# Patient Record
Sex: Female | Born: 1965 | Race: Black or African American | Hispanic: No | Marital: Married
Health system: Southern US, Community
[De-identification: ages and names within clinical notes are randomized; demographics above are authoritative.]

## PROBLEM LIST (undated history)

## (undated) DIAGNOSIS — E039 Hypothyroidism, unspecified: Secondary | ICD-10-CM

## (undated) DIAGNOSIS — C801 Malignant (primary) neoplasm, unspecified: Secondary | ICD-10-CM

---

## 2018-04-18 ENCOUNTER — Other Ambulatory Visit (HOSPITAL_BASED_OUTPATIENT_CLINIC_OR_DEPARTMENT_OTHER): Payer: Self-pay | Admitting: Physician Assistant

## 2018-04-18 DIAGNOSIS — Z1231 Encounter for screening mammogram for malignant neoplasm of breast: Secondary | ICD-10-CM

## 2018-05-12 ENCOUNTER — Ambulatory Visit (HOSPITAL_BASED_OUTPATIENT_CLINIC_OR_DEPARTMENT_OTHER): Payer: 59

## 2018-05-19 ENCOUNTER — Inpatient Hospital Stay (HOSPITAL_BASED_OUTPATIENT_CLINIC_OR_DEPARTMENT_OTHER): Admission: RE | Admit: 2018-05-19 | Payer: 59 | Source: Ambulatory Visit

## 2018-08-15 ENCOUNTER — Ambulatory Visit (HOSPITAL_BASED_OUTPATIENT_CLINIC_OR_DEPARTMENT_OTHER)
Admission: RE | Admit: 2018-08-15 | Discharge: 2018-08-15 | Disposition: A | Discharge status: Home / Self Care | Payer: 59 | Source: Ambulatory Visit | Attending: Physician Assistant | Admitting: Physician Assistant

## 2018-08-15 ENCOUNTER — Encounter (HOSPITAL_BASED_OUTPATIENT_CLINIC_OR_DEPARTMENT_OTHER): Payer: Self-pay

## 2018-08-15 ENCOUNTER — Other Ambulatory Visit: Payer: Self-pay

## 2018-08-15 DIAGNOSIS — Z1231 Encounter for screening mammogram for malignant neoplasm of breast: Secondary | ICD-10-CM | POA: Diagnosis present

## 2018-08-15 HISTORY — DX: Malignant (primary) neoplasm, unspecified: C80.1

## 2018-08-30 ENCOUNTER — Other Ambulatory Visit: Payer: Self-pay | Admitting: Physician Assistant

## 2018-08-30 DIAGNOSIS — R928 Other abnormal and inconclusive findings on diagnostic imaging of breast: Secondary | ICD-10-CM

## 2018-09-06 ENCOUNTER — Ambulatory Visit
Admission: RE | Admit: 2018-09-06 | Discharge: 2018-09-06 | Disposition: A | Discharge status: Home / Self Care | Payer: 59 | Source: Ambulatory Visit | Attending: Physician Assistant | Admitting: Physician Assistant

## 2018-09-06 ENCOUNTER — Other Ambulatory Visit: Payer: Self-pay

## 2018-09-06 DIAGNOSIS — R928 Other abnormal and inconclusive findings on diagnostic imaging of breast: Secondary | ICD-10-CM

## 2021-02-16 ENCOUNTER — Emergency Department (INDEPENDENT_AMBULATORY_CARE_PROVIDER_SITE_OTHER): Payer: 59

## 2021-02-16 ENCOUNTER — Other Ambulatory Visit: Payer: Self-pay

## 2021-02-16 ENCOUNTER — Emergency Department (INDEPENDENT_AMBULATORY_CARE_PROVIDER_SITE_OTHER)
Admission: EM | Admit: 2021-02-16 | Discharge: 2021-02-16 | Disposition: A | Discharge status: Home / Self Care | Payer: 59 | Source: Home / Self Care | Attending: Family Medicine | Admitting: Family Medicine

## 2021-02-16 DIAGNOSIS — K5900 Constipation, unspecified: Secondary | ICD-10-CM

## 2021-02-16 DIAGNOSIS — D259 Leiomyoma of uterus, unspecified: Secondary | ICD-10-CM | POA: Diagnosis not present

## 2021-02-16 DIAGNOSIS — R1031 Right lower quadrant pain: Secondary | ICD-10-CM | POA: Diagnosis not present

## 2021-02-16 DIAGNOSIS — M25561 Pain in right knee: Secondary | ICD-10-CM

## 2021-02-16 HISTORY — DX: Hypothyroidism, unspecified: E03.9

## 2021-02-16 LAB — POCT URINALYSIS DIP (MANUAL ENTRY)
Bilirubin, UA: NEGATIVE
Blood, UA: NEGATIVE
Glucose, UA: NEGATIVE mg/dL
Ketones, POC UA: NEGATIVE mg/dL
Nitrite, UA: NEGATIVE
Protein Ur, POC: NEGATIVE mg/dL
Spec Grav, UA: 1.02 (ref 1.010–1.025)
Urobilinogen, UA: 0.2 E.U./dL
pH, UA: 7 (ref 5.0–8.0)

## 2021-02-16 NOTE — ED Triage Notes (Signed)
Pt states that she has some abdominal pain mainly right side. X1 week  Pt states that she also has some right knee pain. X2 days

## 2021-02-16 NOTE — Discharge Instructions (Signed)
Recommend taking daily Miralax, approximately 1/4 to 1/2 capful mixed in 4 to 8 ounces of water until bowel movements are regular. Recommend increasing natural fiber in diet.  May also take a daily fiber product such as Citrucel with plenty of fluid.  May take Tylenol daytime for pain.

## 2021-02-16 NOTE — ED Provider Notes (Signed)
[facility]    CSN: [id number] Arrival date & time: 02/16/21  1529      History   Chief Complaint Chief Complaint  Patient presents with   Abdominal Pain    Abdominal pain (right side). X1 week   Knee Pain    Right knee pai. X2 days    HPI Cathy Carter is a 55 y.o. female.   Patient reports that she was awakened by a burning localized pain in her right lower quadrant abdomen about a week ago.  The pain has become constant, radiating intermittently to her left lower quadrant.  The pain is somewhat positional and also exacerbated by eating spicy foods.  She has also developed vague aching in her right knee and upper leg.  She recalls no injury or changes in activities.  She denies pelvic pain or vaginal discharge.  She denies urinary symptoms and changes in bowel movements.  She denies fevers, chills, and sweats although she felt hot on one occasion initially. Surgical history of tubal ligation.  She notes that her GYN exams have been normal.  The history is provided by the patient.  Abdominal Pain Pain location:  RLQ Pain quality: burning   Pain radiates to:  LLQ Pain severity:  Mild Onset quality:  Sudden Duration:  1 week Timing:  Constant Progression:  Worsening Chronicity:  New Context: eating and previous surgery   Context: not awakening from sleep, not diet changes, not laxative use, not recent illness, not recent travel, not suspicious food intake and not trauma   Relieved by:  Nothing Exacerbated by: spicy foods. Ineffective treatments:  None tried Associated symptoms: no anorexia, no belching, no chest pain, no chills, no constipation, no cough, no diarrhea, no dysuria, no fatigue, no fever, no flatus, no hematemesis, no hematochezia, no hematuria, no melena, no nausea, no shortness of breath, no vaginal bleeding, no vaginal discharge and no vomiting   Knee Pain Associated symptoms: no fatigue and no fever    Past Medical History:  Diagnosis Date    Cancer ([place])    thyroid    Hypothyroidism     There are no problems to display for this patient.   Past Surgical History:  Procedure Laterality Date   removal of thyroid      OB History   No obstetric history on file.      Home Medications    Prior to Admission medications   Medication Sig Start Date End Date Taking? Authorizing Provider  levothyroxine (SYNTHROID) 100 MCG tablet Take by mouth. 12/01/20  Yes [provider]  levothyroxine (SYNTHROID) 88 MCG tablet Take 112 mcg by mouth. 12/01/20  Yes [provider]    Family History Family History  Problem Relation Age of Onset   Diabetes Mother    Heart attack Father     Social History Social History   Tobacco Use   Smoking status: Former    Types: Cigarettes   Smokeless tobacco: Never  Substance Use Topics   Alcohol use: Yes   Drug use: Never     Allergies   Patient has no known allergies.   Review of Systems Review of Systems  Constitutional:  Negative for activity change, appetite change, chills, diaphoresis, fatigue, fever and unexpected weight change.  HENT: Negative.    Eyes: Negative.   Respiratory:  Negative for cough and shortness of breath.   Cardiovascular:  Negative for chest pain.  Gastrointestinal:  Positive for abdominal pain. Negative for abdominal distention, anorexia, blood in  stool, constipation, diarrhea, flatus, hematemesis, hematochezia, melena, nausea and vomiting.  Genitourinary:  Negative for dysuria, flank pain, frequency, hematuria, pelvic pain, vaginal bleeding and vaginal discharge.  Musculoskeletal:        Right knee and leg pain.  Skin:  Negative for rash.  Neurological:  Negative for headaches.  Hematological:  Negative for adenopathy.    Physical Exam Triage Vital Signs ED Triage Vitals  Enc Vitals Group     BP 02/16/21 1554 (!) 151/88     Pulse Rate 02/16/21 1554 64     Resp 02/16/21 1554 20     Temp 02/16/21 1554 98.5 F (36.9 C)      Temp Source 02/16/21 1554 Oral     SpO2 02/16/21 1554 98 %     Weight 02/16/21 1550 155 lb (70.3 kg)     Height 02/16/21 1550 5\' 2"  (1.575 m)     Head Circumference --      Peak Flow --      Pain Score 02/16/21 1550 6     Pain Loc --      Pain Edu? --      Excl. in [place]? --    No data found.  Updated Vital Signs BP (!) 151/88 (BP Location: Right Arm)    Pulse 64    Temp 98.5 F (36.9 C) (Oral)    Resp 20    Ht 5\' 2"  (1.575 m)    Wt 70.3 kg    SpO2 98%    BMI 28.35 kg/m   Visual Acuity Right Eye Distance:   Left Eye Distance:   Bilateral Distance:    Right Eye Near:   Left Eye Near:    Bilateral Near:     Physical Exam Vitals and nursing note reviewed.  Constitutional:      General: She is not in acute distress.    Appearance: She is not ill-appearing.  HENT:     Head: Normocephalic.     Nose: Nose normal.     Mouth/Throat:     Mouth: Mucous membranes are moist.  Eyes:     Extraocular Movements: Extraocular movements intact.     Pupils: Pupils are equal, round, and reactive to light.  Cardiovascular:     Rate and Rhythm: Normal rate and regular rhythm.     Heart sounds: Normal heart sounds.  Pulmonary:     Breath sounds: Normal breath sounds.  Abdominal:     General: Bowel sounds are normal. There is no distension.     Palpations: Abdomen is soft. There is no fluid wave, hepatomegaly, splenomegaly, mass or pulsatile mass.     Tenderness: There is abdominal tenderness in the right lower quadrant. There is no right CVA tenderness, left CVA tenderness or guarding. Positive signs include McBurney's sign. Negative signs include psoas sign and obturator sign.     Hernia: No hernia is present.       Comments: There is also mild tenderness to palpation in lower abdomen midline.  Musculoskeletal:        General: No swelling or tenderness.     Cervical back: Neck supple.     Right knee: Normal. No bony tenderness. Normal range of motion. No tenderness.  Lymphadenopathy:      Cervical: No cervical adenopathy.  Skin:    General: Skin is warm and dry.     Findings: No rash.  Neurological:     Mental Status: She is alert and oriented to person, place, and time.  UC Treatments / Results  Labs (all labs ordered are listed, but only abnormal results are displayed) Labs Reviewed  POCT URINALYSIS DIP (MANUAL ENTRY) - Abnormal; Notable for the following components:      Result Value   Leukocytes, UA Trace (*)    All other components within normal limits  POCT URINALYSIS DIP (MANUAL ENTRY)    EKG   Radiology DG Abd 2 Views  Result Date: 02/16/2021 CLINICAL DATA:  Right lower quadrant pain for 1 week EXAM: ABDOMEN - 2 VIEW COMPARISON:  None. FINDINGS: Scattered large and small bowel gas is noted. No obstructive changes are seen. No free air is noted. Mild retained fecal material in the colon is seen. Calcified uterine fibroid is noted in the pelvis. No bony abnormality is seen. IMPRESSION: Uterine fibroid. Changes of mild colonic constipation.  No bony abnormality is seen. Electronically Signed   By: Inez Catalina M.D.   On: 02/16/2021 17:55    Procedures Procedures (including critical care time)  Medications Ordered in UC Medications - No data to display  Initial Impression / Assessment and Plan / UC Course  I have reviewed the triage vital signs and the nursing notes.  Pertinent labs & imaging results that were available during my care of the patient were reviewed by me and considered in my medical decision making (see chart for details). Patient's past records reviewed; note mild anemia on most recent CBC.  She admits that she has multiple female relatives who have fibroids.    Suspect combination of large calcified fibroid and constipation causing right lower quadrant pain. Suspect right knee and leg pain a result of abnormal gait in response to her right lower quadrant pain. Recommend follow-up with GYN. If symptoms become significantly worse  during the night or over the weekend, proceed to the local emergency room.   Final Clinical Impressions(s) / UC Diagnoses   Final diagnoses:  Right lower quadrant abdominal pain  Uterine leiomyoma, unspecified location  Constipation, unspecified constipation type  Acute pain of right knee     Discharge Instructions      Recommend taking daily Miralax, approximately 1/4 to 1/2 capful mixed in 4 to 8 ounces of water until bowel movements are regular. Recommend increasing natural fiber in diet.  May also take a daily fiber product such as Citrucel with plenty of fluid.  May take Tylenol daytime for pain.      ED Prescriptions   None       Kandra Nicolas, MD 02/17/21 1514

## 2021-05-14 IMAGING — MG DIGITAL DIAGNOSTIC UNILATERAL LEFT MAMMOGRAM WITH TOMO AND CAD
4 series · 4 of 12 positions shown · non-contrast
Comparison: Previous exam(s).

CLINICAL DATA: Screening recall for a possible left breast mass.

EXAM:
DIGITAL DIAGNOSTIC LEFT MAMMOGRAM WITH CAD AND TOMO
ULTRASOUND LEFT BREAST

[L ML synth-2D]
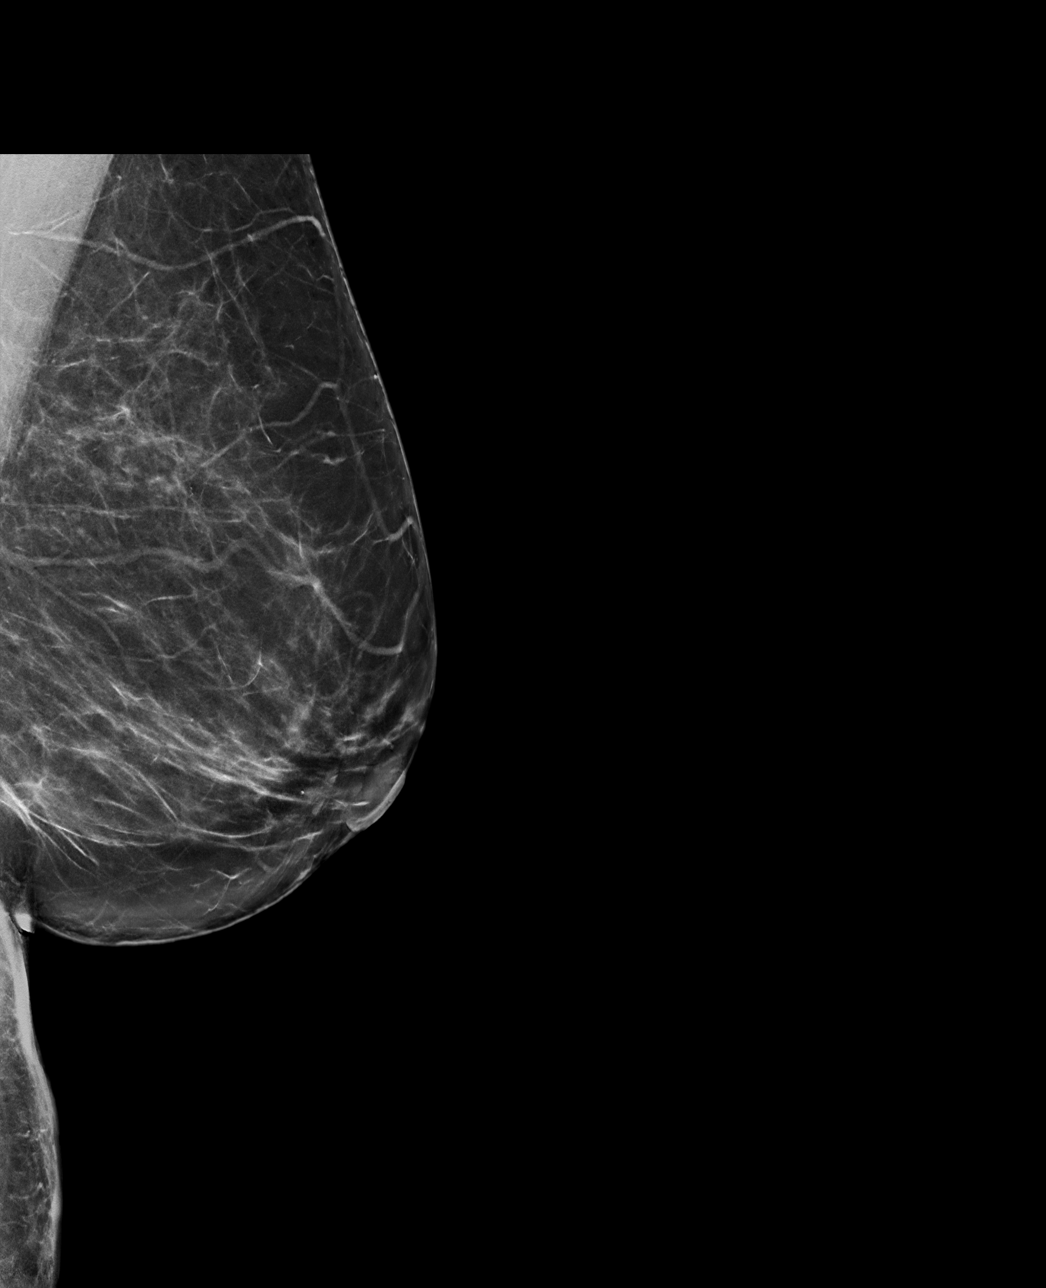

[L MLO synth-2D]
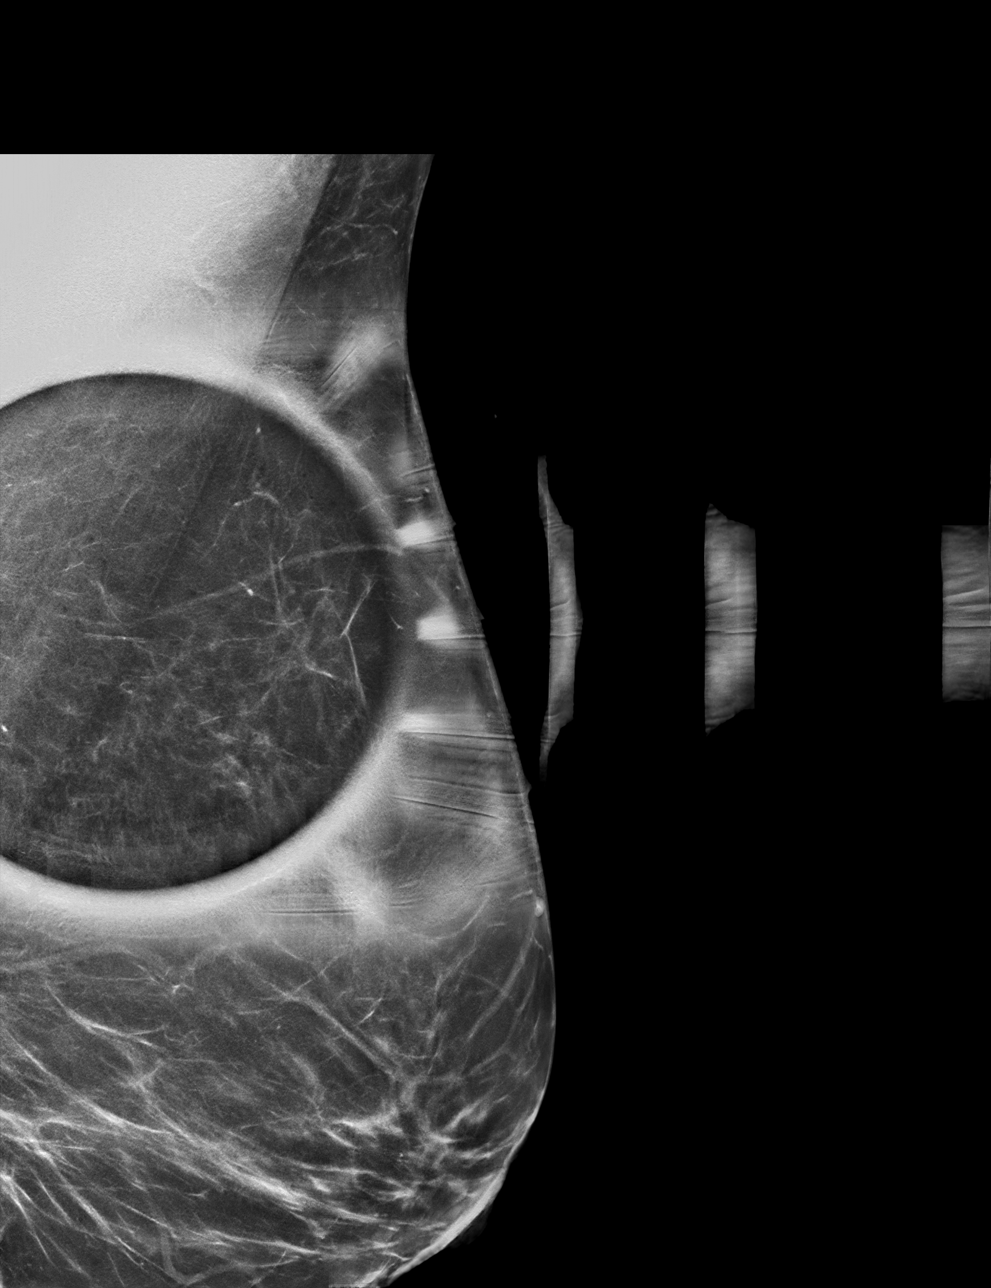

[L MLO tomo · tomo slice 37/73.0]
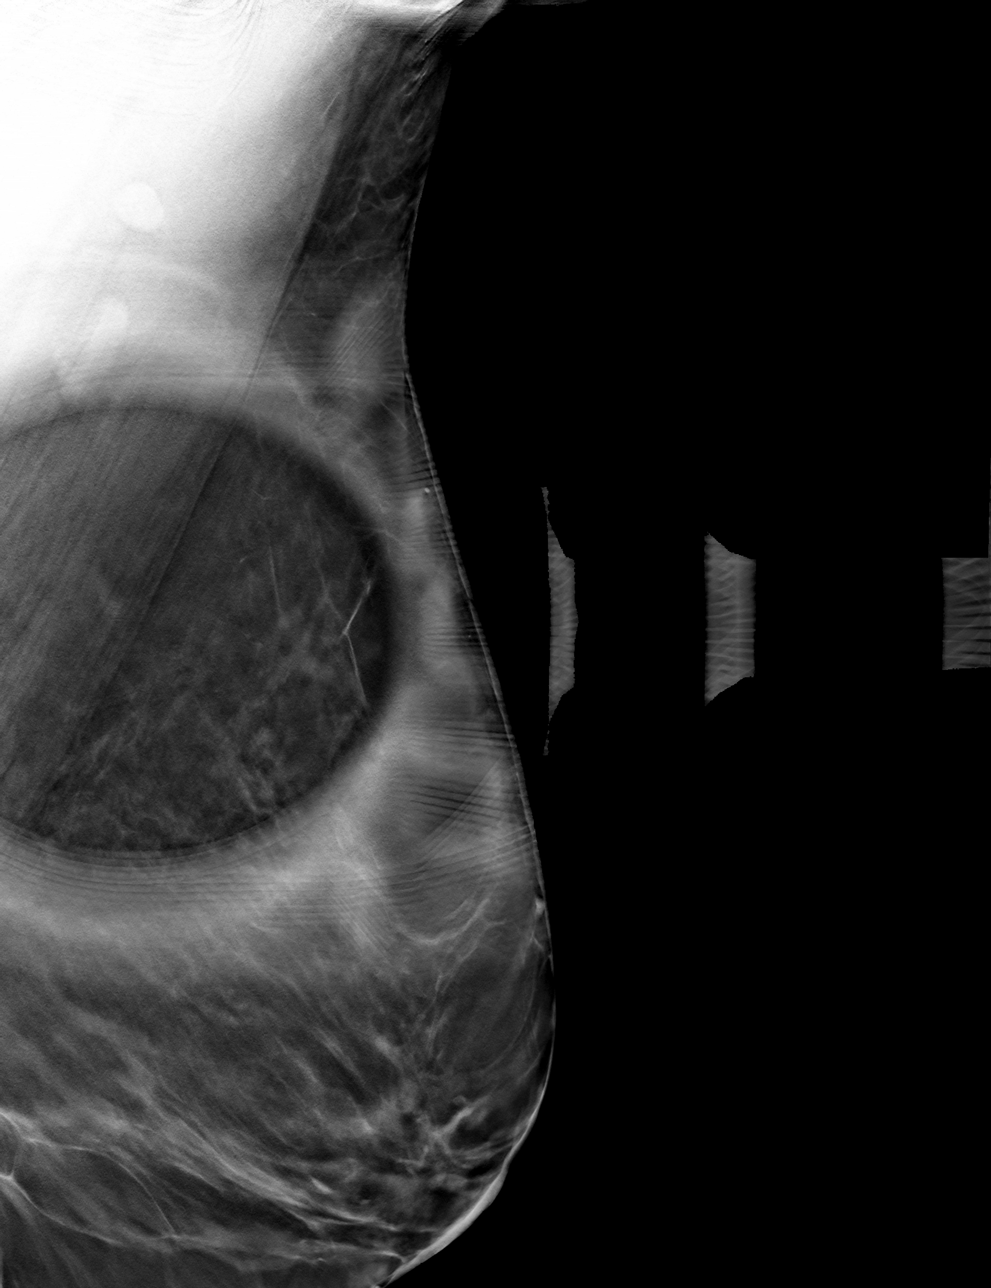

[L ML tomo · tomo slice 40/79.0]
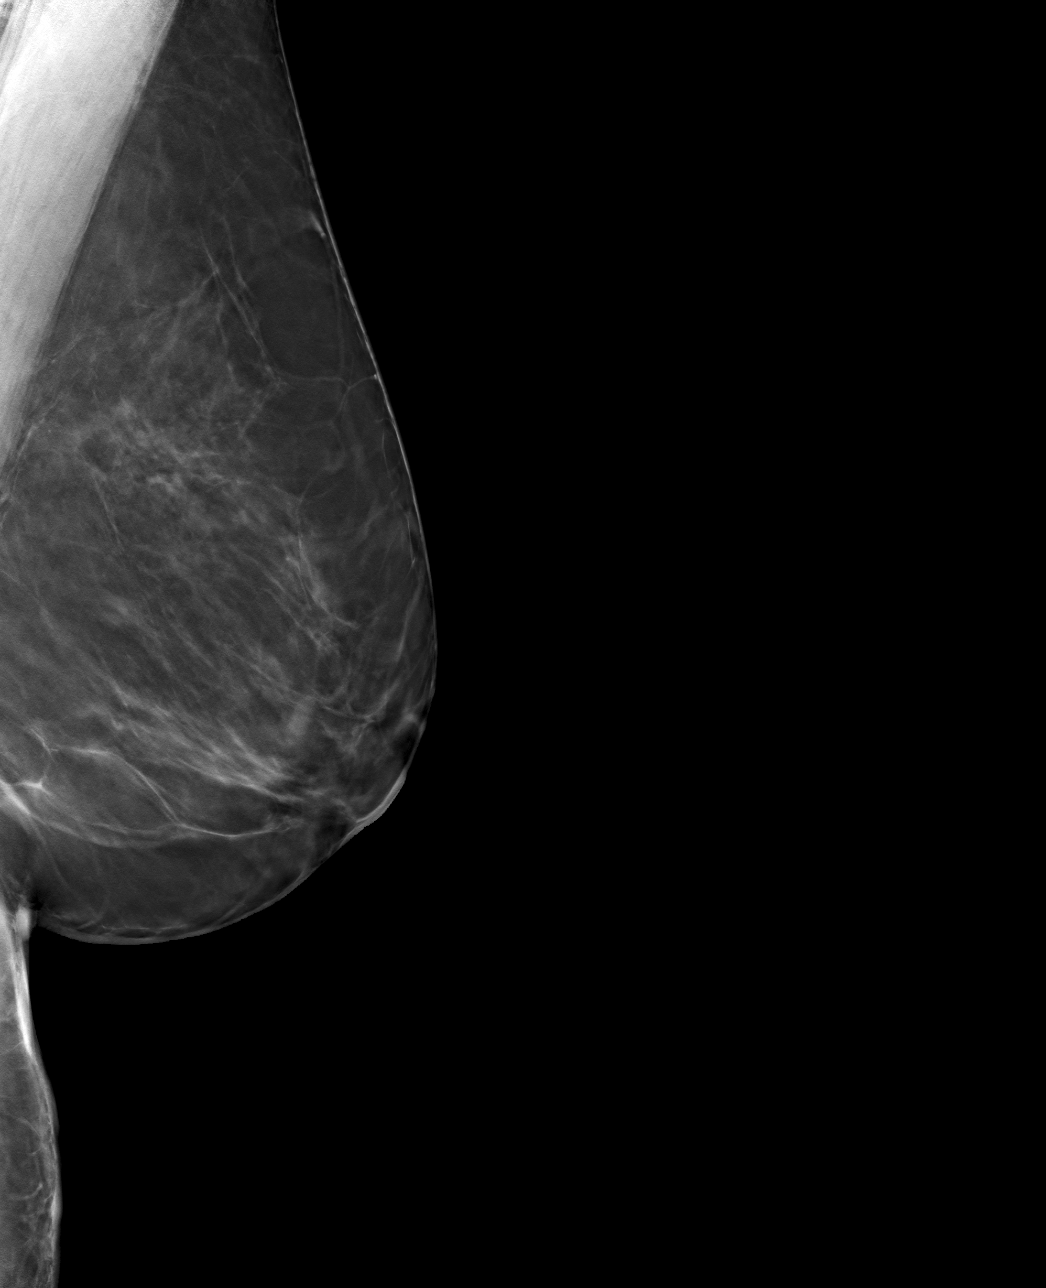

[4 of 12 positions shown; findings below may reference images not displayed]

ACR Breast Density Category b: There are scattered areas of
fibroglandular density.
FINDINGS: The circumscribed mass previously seen in the lateral posterior left
breast is not visualized on the current images. Due to its far
posterior location, ultrasound will be performed for further
evaluation.

Mammographic images were processed with CAD.

Ultrasound of the left breast at 2 o'clock, 10 cm from the nipple
demonstrates a 5 mm hypoechoic circumscribed oval mass with hilar
blood flow on color Doppler imaging compatible with a lymph node.
IMPRESSION: The left breast mass at 2 o'clock is compatible with a benign lymph
node.

RECOMMENDATION:
Screening mammogram in one year.(Code:[id number])

I have discussed the findings and recommendations with the patient.
Results were also provided in writing at the conclusion of the
visit. If applicable, a reminder letter will be sent to the patient
regarding the next appointment.

BI-RADS CATEGORY  1: Negative.

## 2023-10-25 IMAGING — DX DG ABDOMEN 2V
3 series · 3 of 3 positions shown · non-contrast
Comparison: None.

CLINICAL DATA: Right lower quadrant pain for 1 week

EXAM:
ABDOMEN - 2 VIEW

[abdomen erect]
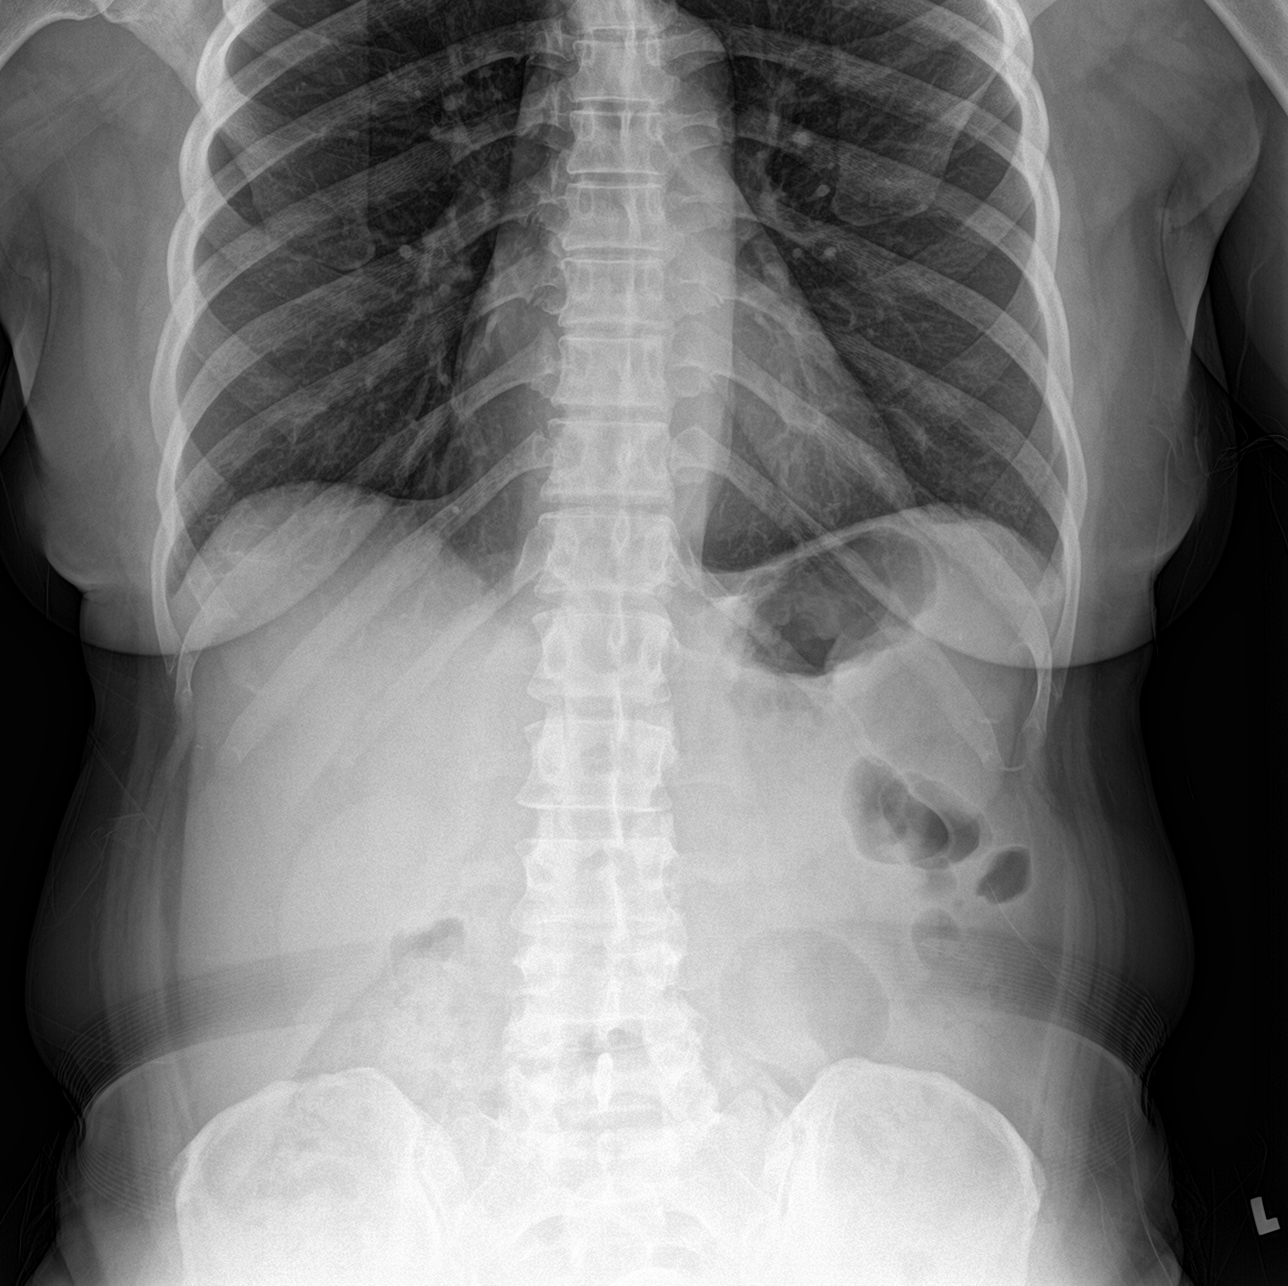

[abdomen supine (1 of 2)]
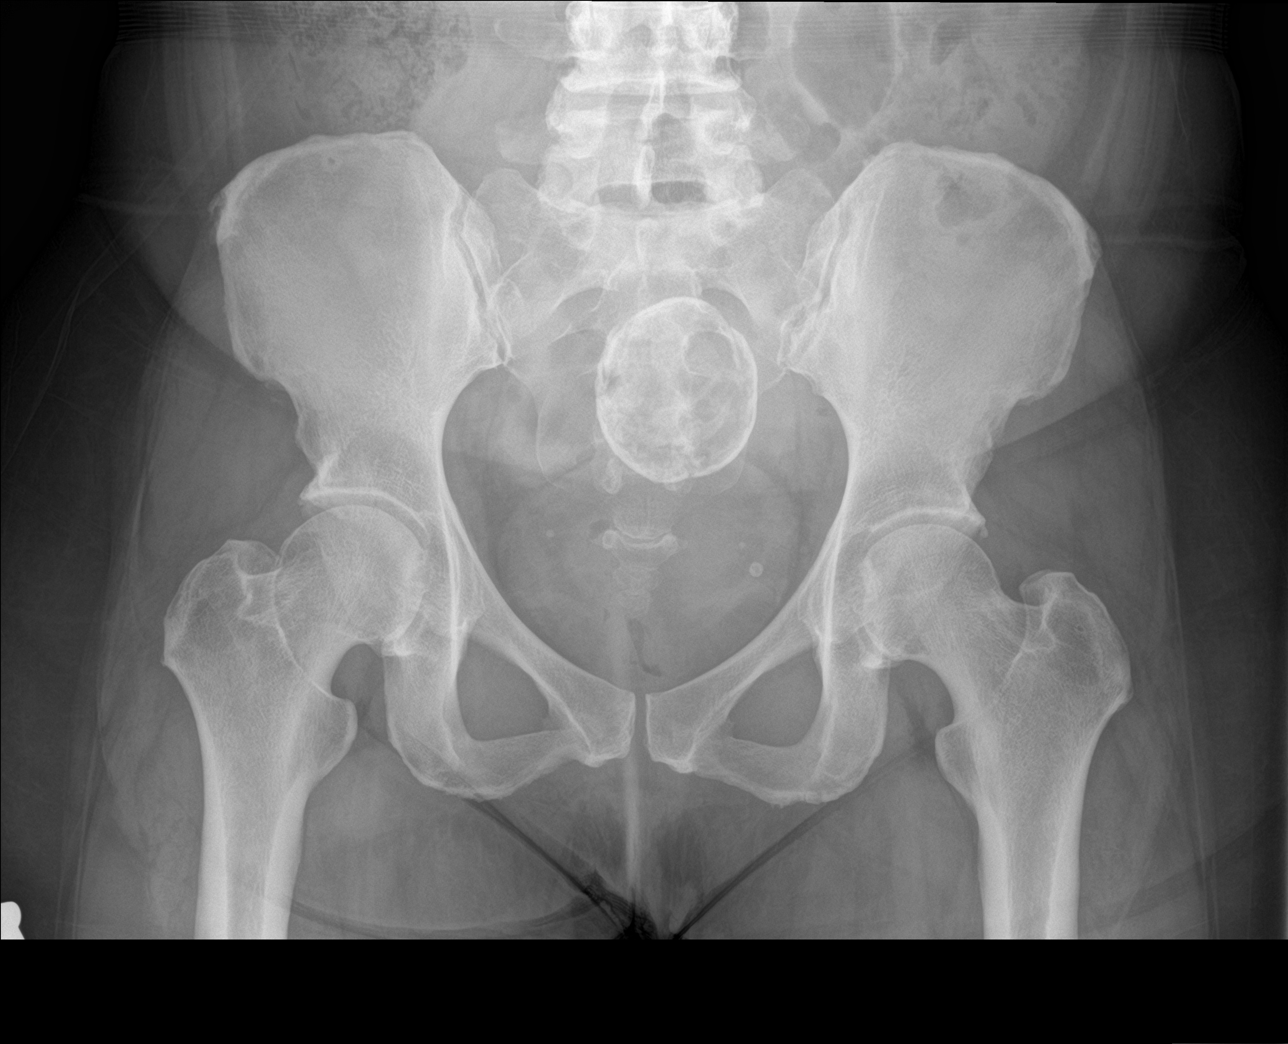

[abdomen supine (2 of 2)]
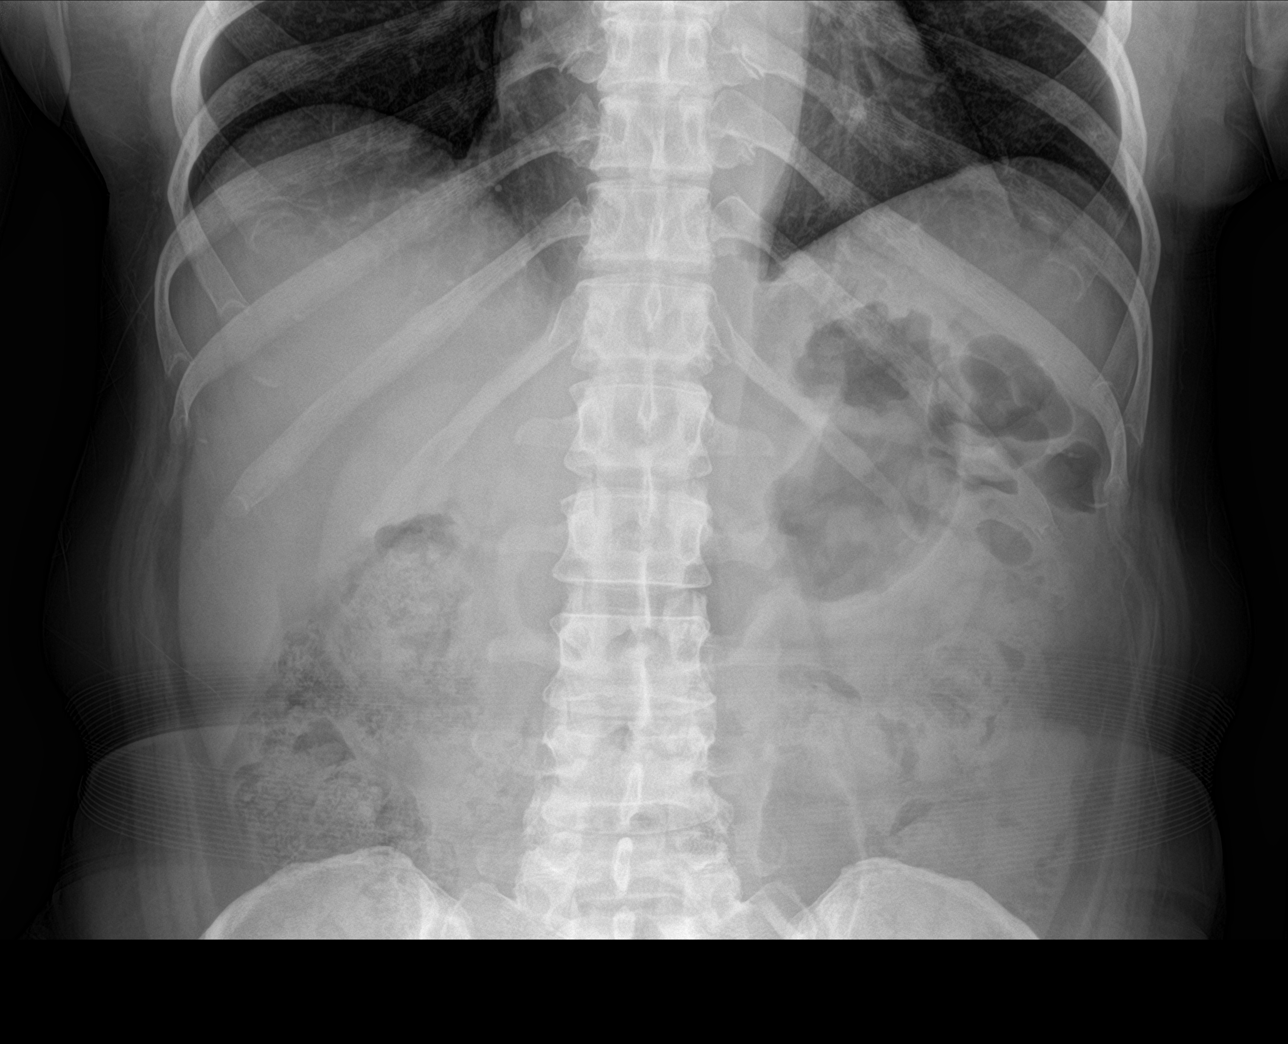

[3 of 3 positions shown; findings below may reference images not displayed]

FINDINGS: Scattered large and small bowel gas is noted. No obstructive changes
are seen. No free air is noted. Mild retained fecal material in the
colon is seen. Calcified uterine fibroid is noted in the pelvis. No
bony abnormality is seen.
IMPRESSION: Uterine fibroid.

Changes of mild colonic constipation.  No bony abnormality is seen.
